# Patient Record
Sex: Female | Born: 1970 | Race: Black or African American | Hispanic: No | Marital: Single | Smoking: Never smoker
Health system: Southern US, Community
[De-identification: ages and names within clinical notes are randomized; demographics above are authoritative.]

## PROBLEM LIST (undated history)

## (undated) DIAGNOSIS — R12 Heartburn: Secondary | ICD-10-CM

## (undated) DIAGNOSIS — R51 Headache: Secondary | ICD-10-CM

## (undated) DIAGNOSIS — M1990 Unspecified osteoarthritis, unspecified site: Secondary | ICD-10-CM

## (undated) HISTORY — PX: CERVICAL SPINE SURGERY: SHX589

## (undated) HISTORY — PX: ABDOMINAL HYSTERECTOMY: SHX81

## (undated) HISTORY — PX: TUBAL LIGATION: SHX77

---

## 2008-05-20 ENCOUNTER — Emergency Department (HOSPITAL_COMMUNITY): Admission: EM | Admit: 2008-05-20 | Discharge: 2008-05-20 | Payer: Self-pay | Admitting: Emergency Medicine

## 2009-03-24 ENCOUNTER — Emergency Department (HOSPITAL_COMMUNITY): Admission: EM | Admit: 2009-03-24 | Discharge: 2009-03-24 | Payer: Self-pay | Admitting: Emergency Medicine

## 2009-04-06 ENCOUNTER — Emergency Department (HOSPITAL_COMMUNITY): Admission: EM | Admit: 2009-04-06 | Discharge: 2009-04-06 | Payer: Self-pay | Admitting: Emergency Medicine

## 2010-02-19 ENCOUNTER — Emergency Department (HOSPITAL_COMMUNITY)
Admission: EM | Admit: 2010-02-19 | Discharge: 2010-02-19 | Payer: Self-pay | Source: Home / Self Care | Admitting: Emergency Medicine

## 2010-03-03 ENCOUNTER — Ambulatory Visit (HOSPITAL_COMMUNITY): Admission: RE | Admit: 2010-03-03 | Discharge: 2010-03-03 | Payer: Self-pay | Admitting: Family Medicine

## 2010-06-22 LAB — URINALYSIS, ROUTINE W REFLEX MICROSCOPIC
Glucose, UA: NEGATIVE mg/dL
Hgb urine dipstick: NEGATIVE
Leukocytes, UA: NEGATIVE
Protein, ur: NEGATIVE mg/dL
Specific Gravity, Urine: 1.02 (ref 1.005–1.030)
pH: 7 (ref 5.0–8.0)

## 2010-06-22 LAB — URINE CULTURE: Culture: NO GROWTH

## 2010-06-22 LAB — URINE MICROSCOPIC-ADD ON

## 2011-01-26 ENCOUNTER — Other Ambulatory Visit (HOSPITAL_COMMUNITY): Payer: Self-pay | Admitting: Pediatrics

## 2011-01-26 ENCOUNTER — Other Ambulatory Visit (HOSPITAL_COMMUNITY): Payer: Self-pay | Admitting: *Deleted

## 2011-01-26 DIAGNOSIS — Z1231 Encounter for screening mammogram for malignant neoplasm of breast: Secondary | ICD-10-CM

## 2011-03-07 ENCOUNTER — Ambulatory Visit (HOSPITAL_COMMUNITY)
Admission: RE | Admit: 2011-03-07 | Discharge: 2011-03-07 | Disposition: A | Discharge status: Home / Self Care | Payer: 59 | Source: Ambulatory Visit | Attending: Pediatrics | Admitting: Pediatrics

## 2011-03-07 DIAGNOSIS — Z1231 Encounter for screening mammogram for malignant neoplasm of breast: Secondary | ICD-10-CM | POA: Insufficient documentation

## 2011-07-13 ENCOUNTER — Other Ambulatory Visit (HOSPITAL_COMMUNITY): Payer: Self-pay | Admitting: Orthopedic Surgery

## 2011-07-13 DIAGNOSIS — M25569 Pain in unspecified knee: Secondary | ICD-10-CM

## 2011-07-13 DIAGNOSIS — M545 Low back pain: Secondary | ICD-10-CM

## 2011-07-16 ENCOUNTER — Ambulatory Visit (HOSPITAL_COMMUNITY)
Admission: RE | Admit: 2011-07-16 | Discharge: 2011-07-16 | Disposition: A | Discharge status: Home / Self Care | Payer: 59 | Source: Ambulatory Visit | Attending: Orthopedic Surgery | Admitting: Orthopedic Surgery

## 2011-07-16 DIAGNOSIS — M25569 Pain in unspecified knee: Secondary | ICD-10-CM

## 2011-07-16 DIAGNOSIS — M545 Low back pain: Secondary | ICD-10-CM

## 2011-07-17 ENCOUNTER — Ambulatory Visit (HOSPITAL_COMMUNITY)
Admission: RE | Admit: 2011-07-17 | Discharge: 2011-07-17 | Disposition: A | Discharge status: Home / Self Care | Payer: 59 | Source: Ambulatory Visit | Attending: Orthopedic Surgery | Admitting: Orthopedic Surgery

## 2011-07-17 DIAGNOSIS — IMO0002 Reserved for concepts with insufficient information to code with codable children: Secondary | ICD-10-CM | POA: Insufficient documentation

## 2011-07-17 DIAGNOSIS — M25569 Pain in unspecified knee: Secondary | ICD-10-CM | POA: Insufficient documentation

## 2011-07-17 DIAGNOSIS — X58XXXA Exposure to other specified factors, initial encounter: Secondary | ICD-10-CM | POA: Insufficient documentation

## 2011-07-17 DIAGNOSIS — R1909 Other intra-abdominal and pelvic swelling, mass and lump: Secondary | ICD-10-CM | POA: Insufficient documentation

## 2011-07-17 DIAGNOSIS — M25559 Pain in unspecified hip: Secondary | ICD-10-CM | POA: Insufficient documentation

## 2011-07-17 DIAGNOSIS — R609 Edema, unspecified: Secondary | ICD-10-CM | POA: Insufficient documentation

## 2011-07-18 ENCOUNTER — Inpatient Hospital Stay (HOSPITAL_COMMUNITY): Admission: RE | Admit: 2011-07-18 | Payer: 59 | Source: Ambulatory Visit

## 2011-07-18 ENCOUNTER — Other Ambulatory Visit (HOSPITAL_COMMUNITY): Payer: 59

## 2011-07-22 ENCOUNTER — Other Ambulatory Visit (HOSPITAL_COMMUNITY): Payer: Self-pay | Admitting: Orthopedic Surgery

## 2011-07-22 ENCOUNTER — Other Ambulatory Visit: Payer: Self-pay | Admitting: Orthopedic Surgery

## 2011-07-22 DIAGNOSIS — N83209 Unspecified ovarian cyst, unspecified side: Secondary | ICD-10-CM

## 2011-07-22 DIAGNOSIS — N83202 Unspecified ovarian cyst, left side: Secondary | ICD-10-CM

## 2011-07-26 ENCOUNTER — Other Ambulatory Visit (HOSPITAL_COMMUNITY): Payer: 59

## 2011-07-26 ENCOUNTER — Ambulatory Visit (HOSPITAL_COMMUNITY)
Admission: RE | Admit: 2011-07-26 | Discharge: 2011-07-26 | Disposition: A | Discharge status: Home / Self Care | Payer: 59 | Source: Ambulatory Visit | Attending: Orthopedic Surgery | Admitting: Orthopedic Surgery

## 2011-07-26 DIAGNOSIS — N83209 Unspecified ovarian cyst, unspecified side: Secondary | ICD-10-CM | POA: Insufficient documentation

## 2011-07-26 DIAGNOSIS — N7013 Chronic salpingitis and oophoritis: Secondary | ICD-10-CM | POA: Insufficient documentation

## 2012-02-08 ENCOUNTER — Other Ambulatory Visit (HOSPITAL_COMMUNITY): Payer: Self-pay | Admitting: Obstetrics and Gynecology

## 2012-02-08 DIAGNOSIS — Z1231 Encounter for screening mammogram for malignant neoplasm of breast: Secondary | ICD-10-CM

## 2012-02-27 ENCOUNTER — Other Ambulatory Visit (HOSPITAL_COMMUNITY): Payer: Self-pay | Admitting: Obstetrics and Gynecology

## 2012-02-27 NOTE — H&P (Signed)
Jennifer Oneal  DICTATION # [id number] CSN# [id number]   Meriel Pica, MD 02/27/2012 1:52 PM

## 2012-02-28 NOTE — H&P (Signed)
NAMESHONTE, BEUTLER                 ACCOUNT NO.:  [id number]  MEDICAL RECORD NO.:  [id number]  LOCATION:                                 FACILITY:  PHYSICIAN:  Duke Salvia. Marcelle Overlie, M.D.DATE OF BIRTH:  Apr 18, 1970  DATE OF ADMISSION: DATE OF DISCHARGE:                             HISTORY & PHYSICAL   CHIEF COMPLAINT:  Pelvic pain.  HISTORY OF PRESENT ILLNESS:  A 41 year old G2, P1, prior hysterectomy done in [place] in 2012.  This patient has had an ectopic through a Pfannenstiel incision, also through that same incision, a tubal ligation with endometrial ablation.  In 2012, in [place], [place] through a midline incision, she had TH, ovaries were stated to be normal at that time.  Recently, she had an MR of her knee, when they were looking at the pelvis they saw a cyst and ordered a followup ultrasound that showed a simple left hydrosalpinx approximately 8.5 cm long with a small amount of free fluid.  When I saw her on August 13th, we did thyroid profile, lipid screen, glucose, and CA-125 that was 5.8.  FSH was 35 at that time.  In the mean time, she had a followup ultrasound in our office that showed persistence of the probable hydrosalpinx, but continues to have pain and presents at this time for open DL with LSO.  This procedure including risks related to bleeding, infection, transfusion, the possible need to complete the surgery by open technique along with her expected recovery time were all reviewed.  PAST MEDICAL HISTORY:  Allergies none.  CURRENT MEDICATIONS:  Ibuprofen p.r.n.  SURGICAL HISTORY:  Cesarean section x1, tubal and endometrial ablation, laparotomy for ectopic pregnancy, and hysterectomy in 2012.  REVIEW OF SYSTEMS:  Significant for prior history of anemia when she was still menstruating.  REVIEW OF SYSTEMS:  Her sister had an MI at age 61.  Also family history of diabetes, phlebitis, hypertension, and breast and lung cancer.  SOCIAL HISTORY:   Denies drug or tobacco use.  She does drink alcohol socially.  Her medical doctor is Dr. Sharee Pimple.  PHYSICAL EXAMINATION:  VITAL SIGNS:  BP 110/62.  She is afebrile. HEENT:  Unremarkable. NECK:  Supple without masses. LUNGS:  Clear. CARDIOVASCULAR:  Regular rate and rhythm without murmurs, rubs, or gallops. BREASTS:  Without masses. ABDOMEN:  Soft, flat, nontender. GU:  Vulva and vaginal cuff unremarkable.  Bimanual otherwise negative. No definite masses noted. EXTREMITIES:  Unremarkable. NEUROLOGIC:  Unremarkable.  IMPRESSION:  Pelvic pain with left hydrosalpinx.  PLAN:  Open diagnostic laparoscopy with LSO, possible laparotomy. Procedure and risks discussed as noted above.     Zariel Capano M. Marcelle Overlie, M.D.     RM[facility]/MEDQ  D:  02/27/2012  T:  02/28/2012  Job:  [id number]

## 2012-02-29 ENCOUNTER — Encounter (HOSPITAL_COMMUNITY): Payer: Self-pay | Admitting: Pharmacist

## 2012-03-06 ENCOUNTER — Ambulatory Visit (HOSPITAL_COMMUNITY): Payer: Managed Care, Other (non HMO)

## 2012-03-06 ENCOUNTER — Ambulatory Visit (HOSPITAL_COMMUNITY): Payer: 59

## 2012-03-06 ENCOUNTER — Encounter (HOSPITAL_COMMUNITY): Payer: Self-pay

## 2012-03-06 ENCOUNTER — Encounter (HOSPITAL_COMMUNITY)
Admission: RE | Admit: 2012-03-06 | Discharge: 2012-03-06 | Disposition: A | Discharge status: Home / Self Care | Payer: Managed Care, Other (non HMO) | Source: Ambulatory Visit | Attending: Obstetrics and Gynecology | Admitting: Obstetrics and Gynecology

## 2012-03-06 HISTORY — DX: Unspecified osteoarthritis, unspecified site: M19.90

## 2012-03-06 HISTORY — DX: Headache: R51

## 2012-03-06 HISTORY — DX: Heartburn: R12

## 2012-03-06 LAB — SURGICAL PCR SCREEN: MRSA, PCR: NEGATIVE

## 2012-03-06 LAB — CBC
MCV: 92.6 fL (ref 78.0–100.0)
Platelets: 300 10*3/uL (ref 150–400)
RDW: 12 % (ref 11.5–15.5)
WBC: 7.6 10*3/uL (ref 4.0–10.5)

## 2012-03-06 NOTE — Patient Instructions (Addendum)
Your procedure is scheduled on:03/12/12  Enter through the Main Entrance at :6am Pick up desk phone and dial [phone] and inform us of your arrival.  Please call [phone] if you have any problems the morning of surgery.  Remember: Do not eat or drink after midnight:Sunday   DO NOT wear jewelry, eye make-up, lipstick,body lotion, or dark fingernail polish. Do not shave for 48 hours prior to surgery.  If you are to be admitted after surgery, leave suitcase in car until your room has been assigned. Patients discharged on the day of surgery will not be allowed to drive home.

## 2012-03-12 ENCOUNTER — Encounter (HOSPITAL_COMMUNITY): Payer: Self-pay | Admitting: Anesthesiology

## 2012-03-12 ENCOUNTER — Ambulatory Visit (HOSPITAL_COMMUNITY): Payer: Managed Care, Other (non HMO) | Admitting: Anesthesiology

## 2012-03-12 ENCOUNTER — Inpatient Hospital Stay (HOSPITAL_COMMUNITY)
Admission: RE | Admit: 2012-03-12 | Discharge: 2012-03-14 | DRG: 743 | Disposition: A | Discharge status: Home / Self Care | Payer: Managed Care, Other (non HMO) | Source: Ambulatory Visit | Attending: Obstetrics and Gynecology | Admitting: Obstetrics and Gynecology

## 2012-03-12 ENCOUNTER — Encounter (HOSPITAL_COMMUNITY)
Admission: RE | Disposition: A | Discharge status: Home / Self Care | Payer: Self-pay | Source: Ambulatory Visit | Attending: Obstetrics and Gynecology

## 2012-03-12 ENCOUNTER — Encounter (HOSPITAL_COMMUNITY): Payer: Self-pay | Admitting: *Deleted

## 2012-03-12 DIAGNOSIS — N802 Endometriosis of fallopian tube: Secondary | ICD-10-CM | POA: Diagnosis present

## 2012-03-12 DIAGNOSIS — N801 Endometriosis of ovary: Secondary | ICD-10-CM | POA: Diagnosis present

## 2012-03-12 DIAGNOSIS — N949 Unspecified condition associated with female genital organs and menstrual cycle: Secondary | ICD-10-CM | POA: Diagnosis present

## 2012-03-12 DIAGNOSIS — N80109 Endometriosis of ovary, unspecified side, unspecified depth: Secondary | ICD-10-CM | POA: Diagnosis present

## 2012-03-12 DIAGNOSIS — N80209 Endometriosis of unspecified fallopian tube, unspecified depth: Secondary | ICD-10-CM | POA: Diagnosis present

## 2012-03-12 DIAGNOSIS — N7013 Chronic salpingitis and oophoritis: Principal | ICD-10-CM | POA: Diagnosis present

## 2012-03-12 DIAGNOSIS — N736 Female pelvic peritoneal adhesions (postinfective): Secondary | ICD-10-CM | POA: Diagnosis present

## 2012-03-12 DIAGNOSIS — Z5331 Laparoscopic surgical procedure converted to open procedure: Secondary | ICD-10-CM

## 2012-03-12 DIAGNOSIS — Z90710 Acquired absence of both cervix and uterus: Secondary | ICD-10-CM

## 2012-03-12 HISTORY — PX: LAPAROTOMY: SHX154

## 2012-03-12 HISTORY — PX: LAPAROSCOPY: SHX197

## 2012-03-12 HISTORY — PX: SALPINGOOPHORECTOMY: SHX82

## 2012-03-12 LAB — CBC
MCH: 30.8 pg (ref 26.0–34.0)
MCHC: 33.8 g/dL (ref 30.0–36.0)
Platelets: 286 10*3/uL (ref 150–400)

## 2012-03-12 SURGERY — LAPAROSCOPY OPERATIVE
Anesthesia: General

## 2012-03-12 MED ORDER — ONDANSETRON HCL 4 MG PO TABS: 4.0000 mg | ORAL_TABLET | Freq: Four times a day (QID) | ORAL | Status: DC | PRN

## 2012-03-12 MED ORDER — BUPIVACAINE HCL (PF) 0.5 % IJ SOLN
INTRAMUSCULAR | Status: AC
  Filled 2012-03-12: qty 270

## 2012-03-12 MED ORDER — KETOROLAC TROMETHAMINE 30 MG/ML IJ SOLN
INTRAMUSCULAR | Status: DC | PRN
  Administered 2012-03-12: 30 mg via INTRAVENOUS

## 2012-03-12 MED ORDER — MENTHOL 3 MG MT LOZG
1.0000 | LOZENGE | OROMUCOSAL | Status: DC | PRN
  Administered 2012-03-12: 3 mg via ORAL
  Filled 2012-03-12: qty 9

## 2012-03-12 MED ORDER — 0.9 % SODIUM CHLORIDE (POUR BTL) OPTIME
TOPICAL | Status: DC | PRN
  Administered 2012-03-12: 1000 mL

## 2012-03-12 MED ORDER — DEXAMETHASONE SODIUM PHOSPHATE 4 MG/ML IJ SOLN
INTRAMUSCULAR | Status: DC | PRN
  Administered 2012-03-12: 10 mg via INTRAVENOUS

## 2012-03-12 MED ORDER — NEOSTIGMINE METHYLSULFATE 1 MG/ML IJ SOLN
INTRAMUSCULAR | Status: AC
  Filled 2012-03-12: qty 10

## 2012-03-12 MED ORDER — MORPHINE SULFATE (PF) 1 MG/ML IV SOLN
INTRAVENOUS | Status: DC
  Administered 2012-03-12: 5 mg via INTRAVENOUS
  Administered 2012-03-12: 12:00:00 via INTRAVENOUS
  Administered 2012-03-13 (×2): 1 mg via INTRAVENOUS
  Administered 2012-03-13: 2 mg via INTRAVENOUS
  Filled 2012-03-12: qty 25

## 2012-03-12 MED ORDER — BUPIVACAINE HCL (PF) 0.25 % IJ SOLN
INTRAMUSCULAR | Status: DC | PRN
  Administered 2012-03-12: 8 mL

## 2012-03-12 MED ORDER — FENTANYL CITRATE 0.05 MG/ML IJ SOLN
INTRAMUSCULAR | Status: AC
  Administered 2012-03-12: 50 ug via INTRAVENOUS
  Filled 2012-03-12: qty 2

## 2012-03-12 MED ORDER — LIDOCAINE HCL (CARDIAC) 20 MG/ML IV SOLN
INTRAVENOUS | Status: AC
  Filled 2012-03-12: qty 5

## 2012-03-12 MED ORDER — DIPHENHYDRAMINE HCL 50 MG/ML IJ SOLN: 12.5000 mg | Freq: Four times a day (QID) | INTRAMUSCULAR | Status: DC | PRN

## 2012-03-12 MED ORDER — ONDANSETRON HCL 4 MG/2ML IJ SOLN
INTRAMUSCULAR | Status: DC | PRN
  Administered 2012-03-12: 4 mg via INTRAVENOUS

## 2012-03-12 MED ORDER — NALOXONE HCL 0.4 MG/ML IJ SOLN: 0.4000 mg | INTRAMUSCULAR | Status: DC | PRN

## 2012-03-12 MED ORDER — CEFAZOLIN SODIUM-DEXTROSE 2-3 GM-% IV SOLR
2.0000 g | Freq: Once | INTRAVENOUS | Status: AC
  Administered 2012-03-12: 2 g via INTRAVENOUS

## 2012-03-12 MED ORDER — MEPERIDINE HCL 25 MG/ML IJ SOLN: 6.2500 mg | INTRAMUSCULAR | Status: DC | PRN

## 2012-03-12 MED ORDER — ONDANSETRON HCL 4 MG/2ML IJ SOLN: 4.0000 mg | Freq: Four times a day (QID) | INTRAMUSCULAR | Status: DC | PRN

## 2012-03-12 MED ORDER — IBUPROFEN 800 MG PO TABS
800.0000 mg | ORAL_TABLET | Freq: Three times a day (TID) | ORAL | Status: DC | PRN
  Administered 2012-03-13 – 2012-03-14 (×3): 800 mg via ORAL
  Filled 2012-03-12 (×3): qty 1

## 2012-03-12 MED ORDER — KETOROLAC TROMETHAMINE 30 MG/ML IJ SOLN
INTRAMUSCULAR | Status: AC
  Filled 2012-03-12: qty 1

## 2012-03-12 MED ORDER — GLYCOPYRROLATE 0.2 MG/ML IJ SOLN
INTRAMUSCULAR | Status: AC
  Filled 2012-03-12: qty 2

## 2012-03-12 MED ORDER — HYDROMORPHONE HCL PF 1 MG/ML IJ SOLN
INTRAMUSCULAR | Status: DC | PRN
  Administered 2012-03-12: 1 mg via INTRAVENOUS

## 2012-03-12 MED ORDER — FENTANYL CITRATE 0.05 MG/ML IJ SOLN
INTRAMUSCULAR | Status: AC
  Filled 2012-03-12: qty 5

## 2012-03-12 MED ORDER — KETOROLAC TROMETHAMINE 30 MG/ML IJ SOLN
30.0000 mg | Freq: Four times a day (QID) | INTRAMUSCULAR | Status: DC
  Administered 2012-03-12 – 2012-03-13 (×3): 30 mg via INTRAVENOUS
  Filled 2012-03-12: qty 1

## 2012-03-12 MED ORDER — ONDANSETRON HCL 4 MG/2ML IJ SOLN: 4.0000 mg | Freq: Once | INTRAMUSCULAR | Status: DC | PRN

## 2012-03-12 MED ORDER — KETOROLAC TROMETHAMINE 30 MG/ML IJ SOLN: 15.0000 mg | Freq: Once | INTRAMUSCULAR | Status: DC | PRN

## 2012-03-12 MED ORDER — BUPIVACAINE HCL (PF) 0.25 % IJ SOLN
INTRAMUSCULAR | Status: AC
  Filled 2012-03-12: qty 30

## 2012-03-12 MED ORDER — NEOSTIGMINE METHYLSULFATE 1 MG/ML IJ SOLN
INTRAMUSCULAR | Status: DC | PRN
  Administered 2012-03-12: 4 mg via INTRAVENOUS

## 2012-03-12 MED ORDER — OXYCODONE-ACETAMINOPHEN 5-325 MG PO TABS
1.0000 | ORAL_TABLET | ORAL | Status: DC | PRN
  Administered 2012-03-13: 1 via ORAL
  Filled 2012-03-12 (×2): qty 1

## 2012-03-12 MED ORDER — DIPHENHYDRAMINE HCL 12.5 MG/5ML PO ELIX: 12.5000 mg | ORAL_SOLUTION | Freq: Four times a day (QID) | ORAL | Status: DC | PRN

## 2012-03-12 MED ORDER — ROCURONIUM BROMIDE 50 MG/5ML IV SOLN
INTRAVENOUS | Status: AC
  Filled 2012-03-12: qty 1

## 2012-03-12 MED ORDER — ROCURONIUM BROMIDE 100 MG/10ML IV SOLN
INTRAVENOUS | Status: DC | PRN
  Administered 2012-03-12: 20 mg via INTRAVENOUS
  Administered 2012-03-12: 30 mg via INTRAVENOUS

## 2012-03-12 MED ORDER — KETOROLAC TROMETHAMINE 30 MG/ML IJ SOLN
30.0000 mg | Freq: Once | INTRAMUSCULAR | Status: DC
  Filled 2012-03-12 (×2): qty 1

## 2012-03-12 MED ORDER — PROPOFOL 10 MG/ML IV EMUL
INTRAVENOUS | Status: DC | PRN
  Administered 2012-03-12: 180 mg via INTRAVENOUS

## 2012-03-12 MED ORDER — HYDROMORPHONE HCL PF 1 MG/ML IJ SOLN
INTRAMUSCULAR | Status: AC
  Filled 2012-03-12: qty 1

## 2012-03-12 MED ORDER — GLYCOPYRROLATE 0.2 MG/ML IJ SOLN
INTRAMUSCULAR | Status: DC | PRN
  Administered 2012-03-12: 0.4 mg via INTRAVENOUS

## 2012-03-12 MED ORDER — ONDANSETRON HCL 4 MG/2ML IJ SOLN
INTRAMUSCULAR | Status: AC
  Filled 2012-03-12: qty 2

## 2012-03-12 MED ORDER — SODIUM CHLORIDE 0.9 % IJ SOLN: 9.0000 mL | INTRAMUSCULAR | Status: DC | PRN

## 2012-03-12 MED ORDER — FENTANYL CITRATE 0.05 MG/ML IJ SOLN
25.0000 ug | INTRAMUSCULAR | Status: DC | PRN
  Administered 2012-03-12 (×2): 50 ug via INTRAVENOUS

## 2012-03-12 MED ORDER — KETOROLAC TROMETHAMINE 30 MG/ML IJ SOLN: 30.0000 mg | Freq: Four times a day (QID) | INTRAMUSCULAR | Status: DC

## 2012-03-12 MED ORDER — MIDAZOLAM HCL 2 MG/2ML IJ SOLN
INTRAMUSCULAR | Status: AC
  Filled 2012-03-12: qty 2

## 2012-03-12 MED ORDER — LACTATED RINGERS IV SOLN
INTRAVENOUS | Status: DC
  Administered 2012-03-12 (×2): via INTRAVENOUS

## 2012-03-12 MED ORDER — ZOLPIDEM TARTRATE 5 MG PO TABS: 5.0000 mg | ORAL_TABLET | Freq: Every evening | ORAL | Status: DC | PRN

## 2012-03-12 MED ORDER — CEFAZOLIN SODIUM-DEXTROSE 2-3 GM-% IV SOLR: 2.0000 g | INTRAVENOUS | Status: DC

## 2012-03-12 MED ORDER — BUPIVACAINE HCL (PF) 0.5 % IJ SOLN
INTRAMUSCULAR | Status: DC | PRN
  Administered 2012-03-12: 270 mL

## 2012-03-12 MED ORDER — PROPOFOL 10 MG/ML IV EMUL
INTRAVENOUS | Status: AC
  Filled 2012-03-12: qty 20

## 2012-03-12 MED ORDER — DEXAMETHASONE SODIUM PHOSPHATE 10 MG/ML IJ SOLN
INTRAMUSCULAR | Status: AC
  Filled 2012-03-12: qty 1

## 2012-03-12 MED ORDER — FENTANYL CITRATE 0.05 MG/ML IJ SOLN
INTRAMUSCULAR | Status: DC | PRN
  Administered 2012-03-12 (×5): 50 ug via INTRAVENOUS

## 2012-03-12 MED ORDER — PHENYLEPHRINE 40 MCG/ML (10ML) SYRINGE FOR IV PUSH (FOR BLOOD PRESSURE SUPPORT)
PREFILLED_SYRINGE | INTRAVENOUS | Status: AC
  Filled 2012-03-12: qty 5

## 2012-03-12 MED ORDER — MIDAZOLAM HCL 5 MG/5ML IJ SOLN
INTRAMUSCULAR | Status: DC | PRN
  Administered 2012-03-12: 1 mg via INTRAVENOUS

## 2012-03-12 MED ORDER — SCOPOLAMINE 1 MG/3DAYS TD PT72
MEDICATED_PATCH | TRANSDERMAL | Status: AC
  Filled 2012-03-12: qty 1

## 2012-03-12 MED ORDER — CEFAZOLIN SODIUM-DEXTROSE 2-3 GM-% IV SOLR
INTRAVENOUS | Status: AC
  Filled 2012-03-12: qty 50

## 2012-03-12 MED ORDER — SCOPOLAMINE 1 MG/3DAYS TD PT72
1.0000 | MEDICATED_PATCH | TRANSDERMAL | Status: DC
  Administered 2012-03-12: 1.5 mg via TRANSDERMAL

## 2012-03-12 MED ORDER — LIDOCAINE HCL (CARDIAC) 20 MG/ML IV SOLN
INTRAVENOUS | Status: DC | PRN
  Administered 2012-03-12: 40 mg via INTRAVENOUS
  Administered 2012-03-12: 20 mg via INTRAVENOUS
  Administered 2012-03-12: 30 mg via INTRAVENOUS

## 2012-03-12 MED ORDER — DEXTROSE IN LACTATED RINGERS 5 % IV SOLN
INTRAVENOUS | Status: DC
  Administered 2012-03-12 – 2012-03-13 (×3): via INTRAVENOUS

## 2012-03-12 MED ORDER — PHENYLEPHRINE HCL 10 MG/ML IJ SOLN
INTRAMUSCULAR | Status: DC | PRN
  Administered 2012-03-12: 120 ug via INTRAVENOUS
  Administered 2012-03-12: 80 ug via INTRAVENOUS

## 2012-03-12 MED ORDER — BUTORPHANOL TARTRATE 1 MG/ML IJ SOLN: 1.0000 mg | INTRAMUSCULAR | Status: DC | PRN

## 2012-03-12 SURGICAL SUPPLY — 60 items
CABLE HIGH FREQUENCY MONO STRZ (ELECTRODE) IMPLANT
CANISTER SUCTION 2500CC (MISCELLANEOUS) ×3 IMPLANT
CATH KIT ON Q 5IN DUAL SLV (PAIN MANAGEMENT) ×3 IMPLANT
CATH ROBINSON RED A/P 16FR (CATHETERS) IMPLANT
CLOTH BEACON ORANGE TIMEOUT ST (SAFETY) ×3 IMPLANT
CONTAINER PREFILL 10% NBF 60ML (FORM) IMPLANT
DECANTER SPIKE VIAL GLASS SM (MISCELLANEOUS) IMPLANT
DERMABOND ADVANCED (GAUZE/BANDAGES/DRESSINGS) ×1
DERMABOND ADVANCED .7 DNX12 (GAUZE/BANDAGES/DRESSINGS) ×2 IMPLANT
DRESSING TELFA 8X3 (GAUZE/BANDAGES/DRESSINGS) ×3 IMPLANT
DRSG TEGADERM 2.38X2.75 (GAUZE/BANDAGES/DRESSINGS) IMPLANT
GAUZE SPONGE 4X4 12PLY STRL LF (GAUZE/BANDAGES/DRESSINGS) ×6 IMPLANT
GLOVE BIO SURGEON STRL SZ7 (GLOVE) ×6 IMPLANT
GLOVE BIO SURGEON STRL SZ8 (GLOVE) ×3 IMPLANT
GLOVE BIOGEL PI IND STRL 6.5 (GLOVE) IMPLANT
GLOVE BIOGEL PI IND STRL 7.0 (GLOVE) ×4 IMPLANT
GLOVE BIOGEL PI INDICATOR 6.5 (GLOVE)
GLOVE BIOGEL PI INDICATOR 7.0 (GLOVE) ×2
GLOVE NEODERM STER SZ 7 (GLOVE) ×3 IMPLANT
GLOVE SURG SS PI 6.5 STRL IVOR (GLOVE) ×3 IMPLANT
GOWN PREVENTION PLUS LG XLONG (DISPOSABLE) ×12 IMPLANT
NEEDLE HYPO 25X1 1.5 SAFETY (NEEDLE) IMPLANT
NEEDLE INSUFFLATION 14GA 120MM (NEEDLE) ×3 IMPLANT
NS IRRIG 1000ML POUR BTL (IV SOLUTION) ×3 IMPLANT
PACK ABDOMINAL GYN (CUSTOM PROCEDURE TRAY) ×3 IMPLANT
PACK LAPAROSCOPY BASIN (CUSTOM PROCEDURE TRAY) ×3 IMPLANT
PAD ABD 7.5X8 STRL (GAUZE/BANDAGES/DRESSINGS) ×3 IMPLANT
PAD OB MATERNITY 4.3X12.25 (PERSONAL CARE ITEMS) ×3 IMPLANT
PENCIL BUTTON HOLSTER BLD 10FT (ELECTRODE) ×3 IMPLANT
POUCH SPECIMEN RETRIEVAL 10MM (ENDOMECHANICALS) IMPLANT
PROTECTOR NERVE ULNAR (MISCELLANEOUS) ×3 IMPLANT
SEALER TISSUE G2 CVD JAW 35 (ENDOMECHANICALS) IMPLANT
SEALER TISSUE G2 CVD JAW 45CM (ENDOMECHANICALS) IMPLANT
SET IRRIG TUBING LAPAROSCOPIC (IRRIGATION / IRRIGATOR) IMPLANT
SPONGE GAUZE 4X4 12PLY (GAUZE/BANDAGES/DRESSINGS) ×3 IMPLANT
SPONGE LAP 18X18 X RAY DECT (DISPOSABLE) ×6 IMPLANT
STRIP CLOSURE SKIN 1/4X4 (GAUZE/BANDAGES/DRESSINGS) ×3 IMPLANT
SUT CHROMIC 0 CT 1 (SUTURE) ×3 IMPLANT
SUT MON AB 2-0 CT1 36 (SUTURE) ×9 IMPLANT
SUT MON AB 4-0 PS1 27 (SUTURE) ×3 IMPLANT
SUT PDS AB 0 CT1 27 (SUTURE) ×6 IMPLANT
SUT SILK 3 0 SH CR/8 (SUTURE) ×3 IMPLANT
SUT VIC AB 0 CT1 18XCR BRD8 (SUTURE) ×2 IMPLANT
SUT VIC AB 0 CT1 8-18 (SUTURE) ×1
SUT VIC AB 2-0 CT1 18 (SUTURE) IMPLANT
SUT VIC AB 2-0 UR6 27 (SUTURE) ×3 IMPLANT
SUT VIC AB 3-0 CT1 27 (SUTURE) ×1
SUT VIC AB 3-0 CT1 TAPERPNT 27 (SUTURE) ×2 IMPLANT
SUT VICRYL 0 TIES 12 18 (SUTURE) ×3 IMPLANT
SUT VICRYL RAPIDE 4/0 PS 2 (SUTURE) ×6 IMPLANT
SYR CONTROL 10ML LL (SYRINGE) ×3 IMPLANT
TAPE CLOTH SURG 4X10 WHT LF (GAUZE/BANDAGES/DRESSINGS) ×3 IMPLANT
TOWEL OR 17X24 6PK STRL BLUE (TOWEL DISPOSABLE) ×6 IMPLANT
TRAY FOLEY CATH 14FR (SET/KITS/TRAYS/PACK) ×3 IMPLANT
TROCAR Z-THREAD BLADED 11X100M (TROCAR) ×3 IMPLANT
TROCAR Z-THREAD BLADED 5X100MM (TROCAR) ×6 IMPLANT
TUBING CONNECTING 10 (TUBING) ×3 IMPLANT
WARMER LAPAROSCOPE (MISCELLANEOUS) ×3 IMPLANT
WATER STERILE IRR 1000ML POUR (IV SOLUTION) ×3 IMPLANT
YANKAUER SUCT BULB TIP NO VENT (SUCTIONS) ×3 IMPLANT

## 2012-03-12 NOTE — Anesthesia Postprocedure Evaluation (Signed)
Anesthesia Post Note  Patient: Jennifer Oneal  Procedure(s) Performed: Procedure(s) (LRB): LAPAROSCOPY OPERATIVE (N/A) SALPINGO OOPHORECTOMY (Bilateral) EXPLORATORY LAPAROTOMY (N/A)  Anesthesia type: General  Patient location: PACU  Post pain: Pain level controlled  Post assessment: Post-op Vital signs reviewed  Last Vitals:  Filed Vitals:   03/12/12 1100  BP: 90/57  Pulse: 59  Temp: 36.6 C  Resp: 18    Post vital signs: Reviewed  Level of consciousness: sedated  Complications: No apparent anesthesia complications

## 2012-03-12 NOTE — OR Nursing (Signed)
On q pump intact to abdominal incision intact upon arrival from or.. 10:48 on q pump intact upon arrival

## 2012-03-12 NOTE — Anesthesia Postprocedure Evaluation (Signed)
  Anesthesia Post-op Note  Patient: Jennifer Oneal  Procedure(s) Performed: Procedure(s) (LRB) with comments: LAPAROSCOPY OPERATIVE (N/A) - open laparoscopy SALPINGO OOPHORECTOMY (Bilateral) EXPLORATORY LAPAROTOMY (N/A)  Patient Location: PACU and Women's Unit  Anesthesia Type:General  Level of Consciousness: awake, alert , oriented and patient cooperative  Airway and Oxygen Therapy: Patient Spontanous Breathing  Post-op Pain: none  Post-op Assessment: Post-op Vital signs reviewed and Patient's Cardiovascular Status Stable  Post-op Vital Signs: Reviewed and stable  Complications: No apparent anesthesia complications

## 2012-03-12 NOTE — Transfer of Care (Signed)
Immediate Anesthesia Transfer of Care Note  Patient: Jennifer Oneal  Procedure(s) Performed: Procedure(s) (LRB) with comments: LAPAROSCOPY OPERATIVE (N/A) - open laparoscopy SALPINGO OOPHORECTOMY (Bilateral) EXPLORATORY LAPAROTOMY (N/A)  Patient Location: PACU  Anesthesia Type:General  Level of Consciousness: awake, sedated and patient cooperative  Airway & Oxygen Therapy: Patient Spontanous Breathing and Patient connected to nasal cannula oxygen  Post-op Assessment: Report given to PACU RN and Post -op Vital signs reviewed and stable  Post vital signs: Reviewed and stable  Complications: No apparent anesthesia complications

## 2012-03-12 NOTE — Anesthesia Preprocedure Evaluation (Addendum)
Anesthesia Evaluation  Patient identified by MRN, date of birth, ID band Patient awake    Reviewed: Allergy & Precautions, H&P , NPO status , Patient's Chart, lab work & pertinent test results  Airway Mallampati: II TM Distance: >3 FB Neck ROM: full    Dental No notable dental hx. (+) Teeth Intact   Pulmonary neg pulmonary ROS,    Pulmonary exam normal       Cardiovascular negative cardio ROS      Neuro/Psych negative psych ROS   GI/Hepatic negative GI ROS, Neg liver ROS,   Endo/Other  negative endocrine ROS  Renal/GU negative Renal ROS  negative genitourinary   Musculoskeletal negative musculoskeletal ROS (+)   Abdominal Normal abdominal exam  (+)   Peds  Hematology negative hematology ROS (+)   Anesthesia Other Findings   Reproductive/Obstetrics negative OB ROS                          Anesthesia Physical Anesthesia Plan  ASA: II  Anesthesia Plan: General   Post-op Pain Management:    Induction: Intravenous  Airway Management Planned: Oral ETT  Additional Equipment:   Intra-op Plan:   Post-operative Plan: Extubation in OR  Informed Consent: I have reviewed the patients History and Physical, chart, labs and discussed the procedure including the risks, benefits and alternatives for the proposed anesthesia with the patient or authorized representative who has indicated his/her understanding and acceptance.   Dental Advisory Given  Plan Discussed with: CRNA and Surgeon  Anesthesia Plan Comments:         Anesthesia Quick Evaluation

## 2012-03-12 NOTE — Addendum Note (Signed)
Addendum  created 03/12/12 1249 by Orlie Pollen, CRNA   Modules edited:Notes Section

## 2012-03-12 NOTE — Op Note (Signed)
Preoperative diagnosis: Pelvic pain, left hydrosalpinx  Postoperative diagnosis: Same, extensive bilateral adnexal adhesions  Procedure: 1. Diagnostic laparoscopy, open   2. Laparotomy with lysis of adhesions, BSO  Surgeon: Marcelle Overlie  Assistant: Huntley Dec  Anesthesia: Gen.  EBL: 100 cc  Specimens removed: Bilateral tubes and ovaries, to pathology  Complications: None    Procedure and findings:  The patient was taken the operating room after an adequate level of general anesthesia was obtained with the patient's legs elevated the abdomen perineum were prepped and draped in usual fashion for laparoscopy. Foley catheter was positioned. Appropriate timeout for taken at that point. The subumbilical area was infiltrated with quarter percent Marcaine plain was elevated between 2 Allis clamps the skin, fascia and peritoneum was entered separately using [employer] retractors. The peritoneum was identified and entered sharply surgeon's finger was then used to explore the surrounding area there were some minimal adhesions to the left lateral but below. The lap scopic sleeve was inserted and pneumoperitoneum was then achieved. After this was accomplished the laparoscope was inserted 3 finger breaths above symphysis in the midline a 5 mm trocar was inserted under direct visualization. With the patient in Trendelenburg the pelvic findings were as follows  The uterus is surgically absent there was a right ovarian cyst approximately 4 cm smooth-walled dense adhesions to the right tube and every 2 pelvic sidewall the sigmoid colon was adherent to the left pelvic sidewall could not identify the left tube and every. Decision made proceed laparotomy that point.  The laparoscope was removed the fascia closed with a 2-0 Vicryl suture and a 4-0 Monocryl subcuticular at the umbilicus attention directed to the old Pfannenstiel scar this was incised carried down to the fascia which was incised and extended transversely.  Rectus muscle divided in the midline peritoneum entered superiorly and extended in a vertical fashion. There were some omental adhesions into the left anterior abdominal wall which were lysed in an avascular plane once this was freed up and O'Connor-O'Sullivan retractor was positioned bowel Speck superiorly out of the field the right tube and every were freed up with sharp and blunt dissection the smooth-walled cyst was noted the rectoperineal space on the right was opened, the left IP ligament was dissected free the course of the right pelvic ureter was well below. The IP ligament was clamped divided and free tie with 0 Vicryl free-tie. The remainder of the right tube and every were dissected free with a clamp cut and suture technique. The left ovary was adherent to the left pelvic sidewall behind an apparent left hydrosalpinx the rectoperineal space on that side was entered and the ovary was dissected free once this was placed on tension further sharp and blunt dissection was used to free up the left tube and ovary with care taken to separate the surrounding sigmoid colon. Once this was freed up the left IP ligament was identified the course of the left ureter was well below the left IP ligament was then clamped divided and suture ligated with 0 Vicryl suture remaining pedicles were clamped cut and suture. The pelvis is irrigated with saline and noted be hemostatic. Prior to closure sponge denies precast reported as correct x2 the appendix was visualized and appeared to be normal. Peritoneum closed the running 2-0 Vicryl suture. 2-0 Vicryl interrupted sutures used to approximate the rectus muscles in the midline the fascia then closed laterally to midline with a 0 PDS suture.  This point the first six-inch needle introducer was then used to introduce  and On-Q catheter into the subfascial space a second six-inch needle introducer was then used to secure and the On-Q catheter in the subcutaneous space. Once these  were affixed the remainder the fascia was closed with a 0 PDS suture. The subcutaneous tissue was undermined to release scar tissue to effect better closure this was irrigated noted be hemostatic 4-0 Monocryl subcuticular suture with a pressure dressing. The On-Q catheters PROM with 4 cc 1% Xylocaine at each site. Clear urine noted in the patient tolerated this well went to recovery room in good condition.  Dictated with [facility]  Jennifer Oneal.D.

## 2012-03-12 NOTE — Progress Notes (Signed)
The patient was re-examined with no change in status 

## 2012-03-13 ENCOUNTER — Encounter (HOSPITAL_COMMUNITY): Payer: Self-pay | Admitting: Obstetrics and Gynecology

## 2012-03-13 LAB — CBC
MCHC: 33.2 g/dL (ref 30.0–36.0)
MCV: 90.9 fL (ref 78.0–100.0)
Platelets: 284 10*3/uL (ref 150–400)
RDW: 11.9 % (ref 11.5–15.5)
WBC: 13.8 10*3/uL — ABNORMAL HIGH (ref 4.0–10.5)

## 2012-03-13 NOTE — Progress Notes (Signed)
1 Day Post-Op Procedure(s) (LRB): LAPAROSCOPY OPERATIVE (N/A) SALPINGO OOPHORECTOMY (Bilateral) EXPLORATORY LAPAROTOMY (N/A)  Subjective: Patient reports tolerating PO.    Objective:BP 102/65  Pulse 97  Temp 99 F (37.2 C) (Oral)  Resp 18  Ht 5\' 2"  (1.575 m)  Wt 178 lb (80.74 kg)  BMI 32.56 kg/m2  SpO2 97% CBC    Component Value Date/Time   WBC 13.8* 03/13/2012 0540   RBC 3.41* 03/13/2012 0540   HGB 10.3* 03/13/2012 0540   HCT 31.0* 03/13/2012 0540   PLT 284 03/13/2012 0540   MCV 90.9 03/13/2012 0540   MCH 30.2 03/13/2012 0540   MCHC 33.2 03/13/2012 0540   RDW 11.9 03/13/2012 0540     I have reviewed patient's vital signs and labs.  inc C/D, + BS  Assessment: s/p Procedure(s) (LRB) with comments: LAPAROSCOPY OPERATIVE (N/A) - open laparoscopy SALPINGO OOPHORECTOMY (Bilateral) EXPLORATORY LAPAROTOMY (N/A): stable  Plan: Encourage ambulation  LOS: 1 day    Patty Leitzke M 03/13/2012, 8:05 AM

## 2012-03-14 MED ORDER — IBUPROFEN 800 MG PO TABS: 800.0000 mg | ORAL_TABLET | Freq: Three times a day (TID) | ORAL | Status: AC | PRN

## 2012-03-14 MED ORDER — OXYCODONE-ACETAMINOPHEN 5-325 MG PO TABS: 1.0000 | ORAL_TABLET | ORAL | Status: AC | PRN

## 2012-03-14 NOTE — Progress Notes (Signed)
2 Days Post-Op Procedure(s) (LRB): LAPAROSCOPY OPERATIVE (N/A) SALPINGO OOPHORECTOMY (Bilateral) EXPLORATORY LAPAROTOMY (N/A)  Subjective: Patient reports tolerating PO.    Objective: BP 101/60  Pulse 68  Temp 98.1 F (36.7 C) (Oral)  Resp 20  Ht 5\' 2"  (1.575 m)  Wt 178 lb (80.74 kg)  BMI 32.56 kg/m2  SpO2 100% I have reviewed patient's vital signs.  On Q cath out intact X 2, abd soft+ BS, inc C/D  Assessment: s/p Procedure(s) (LRB) with comments: LAPAROSCOPY OPERATIVE (N/A) - open laparoscopy SALPINGO OOPHORECTOMY (Bilateral) EXPLORATORY LAPAROTOMY (N/A): stable  Plan: Discharge home  LOS: 2 days    Jennifer Oneal M 03/14/2012, 8:12 AM

## 2012-03-14 NOTE — Discharge Summary (Signed)
Physician Discharge Summary  Patient ID: Jennifer Oneal MRN: [id number] DOB/AGE: 42-26-1972 42 y.o.  Admit date: 03/12/2012 Discharge date: 03/14/2012  Admission Diagnoses:pelvic pain  Discharge Diagnoses: same , S/P BSO, LOA Active Problems:  * No active hospital problems. *    Discharged Condition: good  Hospital Course: DL>>EL with LOA and BSO  Consults: None  Significant Diagnostic Studies: labs:  Results for orders placed during the hospital encounter of 03/12/12 (from the past 48 hour(s))  CBC     Status: Abnormal   Collection Time   03/12/12 12:25 PM      Component Value Range Comment   WBC 18.2 (*) 4.0 - 10.5 K/uL    RBC 3.80 (*) 3.87 - 5.11 MIL/uL    Hemoglobin 11.7 (*) 12.0 - 15.0 g/dL    HCT 82.9 (*) 56.2 - 46.0 %    MCV 91.1  78.0 - 100.0 fL    MCH 30.8  26.0 - 34.0 pg    MCHC 33.8  30.0 - 36.0 g/dL    RDW 13.0  86.5 - 78.4 %    Platelets 286  150 - 400 K/uL   CBC     Status: Abnormal   Collection Time   03/13/12  5:40 AM      Component Value Range Comment   WBC 13.8 (*) 4.0 - 10.5 K/uL    RBC 3.41 (*) 3.87 - 5.11 MIL/uL    Hemoglobin 10.3 (*) 12.0 - 15.0 g/dL    HCT 69.6 (*) 29.5 - 46.0 %    MCV 90.9  78.0 - 100.0 fL    MCH 30.2  26.0 - 34.0 pg    MCHC 33.2  30.0 - 36.0 g/dL    RDW 28.4  13.2 - 44.0 %    Platelets 284  150 - 400 K/uL     Treatments: surgery: BSO  Discharge Exam: Blood pressure 101/60, pulse 68, temperature 98.1 F (36.7 C), temperature source Oral, resp. rate 20, height 5\' 2"  (1.575 m), weight 178 lb (80.74 kg), SpO2 100.00%. abd soft + BS, Inc C/D  Disposition: Final discharge disposition not confirmed  Discharge Orders    Future Appointments: Provider: Department: Dept Phone: Center:   03/23/2012 11:45 AM [facility] Lovie Macadamia MAMMOGRAPHY [phone]       Medication List     As of 03/14/2012  8:15 AM    STOP taking these medications         COMPOUNDED TOPICALS BUILDER      TAKE these  medications         acidophilus Caps   Take 1 capsule by mouth daily.      ibuprofen 800 MG tablet   Commonly known as: ADVIL,MOTRIN   Take 1 tablet (800 mg total) by mouth every 8 (eight) hours as needed (mild pain).      oxyCODONE-acetaminophen 5-325 MG per tablet   Commonly known as: PERCOCET/ROXICET   Take 1-2 tablets by mouth every 4 (four) hours as needed (moderate to severe pain (when tolerating fluids)).           Follow-up Information    Follow up with Meriel Pica, MD.   Contact information:   [address] [place] [address] [phone]          Signed: Meriel Pica 03/14/2012, 8:15 AM

## 2012-03-23 ENCOUNTER — Ambulatory Visit (HOSPITAL_COMMUNITY)
Admission: RE | Admit: 2012-03-23 | Discharge: 2012-03-23 | Disposition: A | Discharge status: Home / Self Care | Payer: Managed Care, Other (non HMO) | Source: Ambulatory Visit | Attending: Obstetrics and Gynecology | Admitting: Obstetrics and Gynecology

## 2012-03-23 DIAGNOSIS — Z1231 Encounter for screening mammogram for malignant neoplasm of breast: Secondary | ICD-10-CM | POA: Insufficient documentation

## 2013-03-11 ENCOUNTER — Other Ambulatory Visit (HOSPITAL_COMMUNITY): Payer: Self-pay | Admitting: Obstetrics and Gynecology

## 2013-03-11 DIAGNOSIS — Z1231 Encounter for screening mammogram for malignant neoplasm of breast: Secondary | ICD-10-CM

## 2013-03-25 ENCOUNTER — Ambulatory Visit (HOSPITAL_COMMUNITY): Payer: Managed Care, Other (non HMO)

## 2013-03-26 ENCOUNTER — Ambulatory Visit (HOSPITAL_COMMUNITY): Payer: Managed Care, Other (non HMO)

## 2013-04-15 ENCOUNTER — Ambulatory Visit (HOSPITAL_COMMUNITY)
Admission: RE | Admit: 2013-04-15 | Discharge: 2013-04-15 | Disposition: A | Discharge status: Home / Self Care | Payer: Federal, State, Local not specified - PPO | Source: Ambulatory Visit | Attending: Obstetrics and Gynecology | Admitting: Obstetrics and Gynecology

## 2013-04-15 ENCOUNTER — Ambulatory Visit (HOSPITAL_COMMUNITY): Payer: Managed Care, Other (non HMO)

## 2013-04-15 DIAGNOSIS — Z1231 Encounter for screening mammogram for malignant neoplasm of breast: Secondary | ICD-10-CM | POA: Insufficient documentation

## 2013-04-16 ENCOUNTER — Ambulatory Visit (HOSPITAL_COMMUNITY): Payer: Managed Care, Other (non HMO)

## 2013-10-31 ENCOUNTER — Other Ambulatory Visit: Payer: Self-pay | Admitting: Internal Medicine

## 2013-10-31 DIAGNOSIS — G935 Compression of brain: Secondary | ICD-10-CM

## 2013-11-08 ENCOUNTER — Other Ambulatory Visit: Payer: Managed Care, Other (non HMO)

## 2014-04-17 ENCOUNTER — Other Ambulatory Visit: Payer: Self-pay | Admitting: Occupational Medicine

## 2014-04-17 ENCOUNTER — Ambulatory Visit: Payer: Self-pay

## 2014-04-17 DIAGNOSIS — M25512 Pain in left shoulder: Secondary | ICD-10-CM

## 2014-04-23 ENCOUNTER — Other Ambulatory Visit (HOSPITAL_COMMUNITY): Payer: Self-pay | Admitting: Obstetrics and Gynecology

## 2014-04-23 DIAGNOSIS — Z1231 Encounter for screening mammogram for malignant neoplasm of breast: Secondary | ICD-10-CM

## 2014-05-01 ENCOUNTER — Ambulatory Visit (HOSPITAL_COMMUNITY)
Admission: RE | Admit: 2014-05-01 | Discharge: 2014-05-01 | Disposition: A | Discharge status: Home / Self Care | Payer: Managed Care, Other (non HMO) | Source: Ambulatory Visit | Attending: Obstetrics and Gynecology | Admitting: Obstetrics and Gynecology

## 2014-05-01 DIAGNOSIS — Z1231 Encounter for screening mammogram for malignant neoplasm of breast: Secondary | ICD-10-CM

## 2015-06-23 ENCOUNTER — Other Ambulatory Visit: Payer: Self-pay

## 2015-06-23 DIAGNOSIS — Z803 Family history of malignant neoplasm of breast: Secondary | ICD-10-CM

## 2015-06-23 DIAGNOSIS — Z1231 Encounter for screening mammogram for malignant neoplasm of breast: Secondary | ICD-10-CM

## 2015-07-03 ENCOUNTER — Ambulatory Visit
Admission: RE | Admit: 2015-07-03 | Discharge: 2015-07-03 | Disposition: A | Discharge status: Home / Self Care | Payer: Managed Care, Other (non HMO) | Source: Ambulatory Visit

## 2015-07-03 DIAGNOSIS — Z803 Family history of malignant neoplasm of breast: Secondary | ICD-10-CM

## 2015-07-03 DIAGNOSIS — Z1231 Encounter for screening mammogram for malignant neoplasm of breast: Secondary | ICD-10-CM

## 2015-09-29 ENCOUNTER — Other Ambulatory Visit: Payer: Self-pay | Admitting: Family

## 2015-09-29 DIAGNOSIS — R51 Headache: Secondary | ICD-10-CM

## 2015-09-29 DIAGNOSIS — R519 Headache, unspecified: Secondary | ICD-10-CM

## 2015-09-29 DIAGNOSIS — G935 Compression of brain: Secondary | ICD-10-CM

## 2015-09-29 DIAGNOSIS — Z8673 Personal history of transient ischemic attack (TIA), and cerebral infarction without residual deficits: Secondary | ICD-10-CM

## 2015-10-09 ENCOUNTER — Ambulatory Visit
Admission: RE | Admit: 2015-10-09 | Discharge: 2015-10-09 | Disposition: A | Discharge status: Home / Self Care | Payer: Managed Care, Other (non HMO) | Source: Ambulatory Visit | Attending: Family | Admitting: Family

## 2015-10-09 DIAGNOSIS — Z8673 Personal history of transient ischemic attack (TIA), and cerebral infarction without residual deficits: Secondary | ICD-10-CM

## 2015-10-09 DIAGNOSIS — G935 Compression of brain: Secondary | ICD-10-CM

## 2015-10-09 DIAGNOSIS — R519 Headache, unspecified: Secondary | ICD-10-CM

## 2015-10-09 DIAGNOSIS — R51 Headache: Secondary | ICD-10-CM

## 2019-05-27 ENCOUNTER — Other Ambulatory Visit: Payer: Self-pay

## 2019-05-27 ENCOUNTER — Telehealth: Payer: Self-pay

## 2019-05-27 DIAGNOSIS — Z1231 Encounter for screening mammogram for malignant neoplasm of breast: Secondary | ICD-10-CM

## 2019-06-25 ENCOUNTER — Ambulatory Visit: Payer: Self-pay | Admitting: Advanced Practice Midwife

## 2019-06-25 ENCOUNTER — Other Ambulatory Visit: Payer: Self-pay

## 2019-06-25 ENCOUNTER — Ambulatory Visit
Admission: RE | Admit: 2019-06-25 | Discharge: 2019-06-25 | Disposition: A | Discharge status: Home / Self Care | Payer: Managed Care, Other (non HMO) | Source: Ambulatory Visit | Attending: Obstetrics and Gynecology | Admitting: Obstetrics and Gynecology

## 2019-06-25 VITALS — BP 122/80 | Temp 97.1°F | Wt 204.0 lb

## 2019-06-25 DIAGNOSIS — Z1239 Encounter for other screening for malignant neoplasm of breast: Secondary | ICD-10-CM

## 2019-06-25 DIAGNOSIS — Z1231 Encounter for screening mammogram for malignant neoplasm of breast: Secondary | ICD-10-CM

## 2019-06-25 NOTE — Progress Notes (Signed)
Ms. Jennifer Oneal is a 49 y.o. female who presents to [facility] clinic today with no complaints.    Pap Smear: Pap not smear completed today. Last Pap smear was unknown at [facility] clinic and was normal. Per patient has no history of an abnormal Pap smear. Last Pap smear result is not available in Epic.  Patient has TH in 2012 for abnormal bleeding and fibroids. The had BSO in 2014.    Physical exam: Breasts Breasts symmetrical. No skin abnormalities bilateral breasts. No nipple retraction bilateral breasts. No nipple discharge bilateral breasts. No lymphadenopathy. No lumps palpated bilateral breasts.       Pelvic/Bimanual Pap is not indicated today    Smoking History: Patient has never smoked Not referred to quit line.    Patient Navigation: Patient education provided. Access to services provided for patient through [facility] program. No interpreter provided. No transportation provided   Colorectal Cancer Screening: Per patient has had colonoscopy completed on 2017 No complaints today.    Breast and Cervical Cancer Risk Assessment: Patient has family history of breast cancer, known genetic mutations, or radiation treatment to the chest before age 68. Patient does not have history of cervical dysplasia, immunocompromised, or DES exposure in-utero.  Risk Assessment    Risk Scores      06/25/2019   Last edited by: Narda Rutherford, LPN   5-year risk: 1.8 %   Lifetime risk: 13.7 %          A: BCCCP exam without pap smear Complaint of none  P: Referred patient to the [facility] for a screening mammogram. Appointment scheduled 06/25/2019 at 10:30.  Thressa Sheller DNP, CNM  06/25/19  9:28 AM

## 2020-05-15 ENCOUNTER — Other Ambulatory Visit: Payer: Self-pay | Admitting: Obstetrics and Gynecology

## 2020-05-15 DIAGNOSIS — Z1231 Encounter for screening mammogram for malignant neoplasm of breast: Secondary | ICD-10-CM

## 2020-07-03 ENCOUNTER — Ambulatory Visit
Admission: RE | Admit: 2020-07-03 | Discharge: 2020-07-03 | Disposition: A | Discharge status: Home / Self Care | Payer: BLUE CROSS/BLUE SHIELD | Source: Ambulatory Visit | Attending: Obstetrics and Gynecology | Admitting: Obstetrics and Gynecology

## 2020-07-03 ENCOUNTER — Other Ambulatory Visit: Payer: Self-pay

## 2020-07-03 DIAGNOSIS — Z1231 Encounter for screening mammogram for malignant neoplasm of breast: Secondary | ICD-10-CM

## 2022-09-27 ENCOUNTER — Other Ambulatory Visit: Payer: Self-pay | Admitting: Adult Health Nurse Practitioner

## 2022-09-27 DIAGNOSIS — Z0000 Encounter for general adult medical examination without abnormal findings: Secondary | ICD-10-CM

## 2022-09-28 ENCOUNTER — Other Ambulatory Visit: Payer: Self-pay

## 2022-09-28 ENCOUNTER — Emergency Department (HOSPITAL_BASED_OUTPATIENT_CLINIC_OR_DEPARTMENT_OTHER): Payer: BC Managed Care – PPO

## 2022-09-28 ENCOUNTER — Emergency Department (HOSPITAL_COMMUNITY)
Admission: EM | Admit: 2022-09-28 | Discharge: 2022-09-28 | Disposition: A | Discharge status: Home / Self Care | Payer: BC Managed Care – PPO | Attending: Emergency Medicine | Admitting: Emergency Medicine

## 2022-09-28 DIAGNOSIS — M79605 Pain in left leg: Secondary | ICD-10-CM | POA: Diagnosis present

## 2022-09-28 DIAGNOSIS — M79609 Pain in unspecified limb: Secondary | ICD-10-CM | POA: Diagnosis not present

## 2022-09-28 DIAGNOSIS — M7989 Other specified soft tissue disorders: Secondary | ICD-10-CM | POA: Diagnosis not present

## 2022-09-28 MED ORDER — MUPIROCIN CALCIUM 2 % EX CREA
TOPICAL_CREAM | Freq: Once | CUTANEOUS | Status: AC
Start: 2022-09-28 — End: 2022-09-28
  Filled 2022-09-28: qty 15

## 2022-09-28 NOTE — Discharge Instructions (Addendum)
Today's evaluation has been reassuring.  Please use the provided antibiotic cream on your lower leg wound twice daily for the next 5 days. In regards to the left lateral thigh pain.  Please obtain and use Salonpas large medicated patches including the ingredient methyl salicylate.  There are several formulations of this medication available, be sure to obtain this 1 is a will likely provide the greatest benefit.  Return here for concerning changes in your condition.

## 2022-09-28 NOTE — ED Triage Notes (Signed)
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

## 2022-09-28 NOTE — ED Provider Notes (Signed)
[place] EMERGENCY DEPARTMENT AT [facility] Provider Note   CSN: [id number] Arrival date & time: 09/28/22  5784     History  Chief Complaint  Patient presents with   Leg Pain    Jennifer Oneal is a 52 y.o. female.  HPI Patient presents with concern of leg pain.  She has 2 issues on her left leg that are of concern.  Most acute change is a erythematous lesion in the left anterior lower leg.  She notes that this was previously more prominent, but over the past day has become less so.  It is erythematous, raised.  No distal loss of sensation or weakness. Slightly longer issue is left posterior lateral thigh pain.  She has been seen by her primary care physician and orthopedist, has been diagnosed with sciatica, but notes that though she does have some low back pain, this pain is distinct, with a palpable lesion in the area of concern, with swelling. No recent long distance, no oral contraceptive use, but she is perseverant on blood clot as a cause of her pain.  She has had some relief with previously prescribed medication.     Home Medications Prior to Admission medications   Medication Sig Start Date End Date Taking? Authorizing Provider  acidophilus (RISAQUAD) CAPS Take 1 capsule by mouth daily.    [provider]  ibuprofen (ADVIL,MOTRIN) 800 MG tablet Take 1 tablet (800 mg total) by mouth every 8 (eight) hours as needed (mild pain). 03/14/12   Richarda Overlie, MD  oxyCODONE-acetaminophen (PERCOCET/ROXICET) 5-325 MG per tablet Take 1-2 tablets by mouth every 4 (four) hours as needed (moderate to severe pain (when tolerating fluids)). 03/14/12   Richarda Overlie, MD      Allergies    Patient has no known allergies.    Review of Systems   Review of Systems  All other systems reviewed and are negative.   Physical Exam Updated Vital Signs BP (!) 156/105 (BP Location: Right Arm)   Pulse 80   Temp 98 F (36.7 C) (Oral)   Resp 16   Ht 5\' 2"  (1.575 m)   Wt  90.3 kg   SpO2 100%   BMI 36.40 kg/m  Physical Exam Vitals and nursing note reviewed.  Constitutional:      General: She is not in acute distress.    Appearance: She is well-developed.  HENT:     Head: Normocephalic and atraumatic.  Eyes:     Conjunctiva/sclera: Conjunctivae normal.  Cardiovascular:     Rate and Rhythm: Normal rate and regular rhythm.  Pulmonary:     Effort: Pulmonary effort is normal. No respiratory distress.     Breath sounds: Normal breath sounds. No stridor.  Abdominal:     General: There is no distension.  Musculoskeletal:     Comments: No appreciable deformities.  Patient moves all extremities spontaneously.  Left lateral posterior thigh tender to palpation, unclear if this is slightly raised due to habitus.  Skin:    General: Skin is warm and dry.       Neurological:     Mental Status: She is alert and oriented to person, place, and time.     Cranial Nerves: No cranial nerve deficit.  Psychiatric:        Mood and Affect: Mood normal.     ED Results / Procedures / Treatments   Labs (all labs ordered are listed, but only abnormal results are displayed) Labs Reviewed - No data to display  EKG  None  Radiology VAS [place] LOWER EXTREMITY VENOUS (DVT) (7a-7p)  Result Date: 09/28/2022  Lower Venous DVT Study Patient Name:  Jennifer Oneal  Date of Exam:   09/28/2022 Medical Rec #: [id number]    Accession #:    [id number] Date of Birth: Jul 01, 1970    Patient Gender: F Patient Age:   21 years Exam Location:  [facility] Procedure:      VAS [place] LOWER EXTREMITY VENOUS (DVT) Referring Phys: Molly Maduro Rethel Sebek --------------------------------------------------------------------------------  Indications: Posterolateral thigh pain / localized swelling.  Limitations: Poor ultrasound/tissue interface. Comparison Study: No previous exams Performing Technologist: Jody Hill RVT, RDMS  Examination Guidelines: A complete evaluation includes B-mode imaging, spectral Doppler,  color Doppler, and power Doppler as needed of all accessible portions of each vessel. Bilateral testing is considered an integral part of a complete examination. Limited examinations for reoccurring indications may be performed as noted. The reflux portion of the exam is performed with the patient in reverse Trendelenburg.  +-----+---------------+---------+-----------+----------+--------------+ RIGHTCompressibilityPhasicitySpontaneityPropertiesThrombus Aging +-----+---------------+---------+-----------+----------+--------------+ CFV  Full           Yes      Yes                                 +-----+---------------+---------+-----------+----------+--------------+   +---------+---------------+---------+-----------+----------+--------------+ LEFT     CompressibilityPhasicitySpontaneityPropertiesThrombus Aging +---------+---------------+---------+-----------+----------+--------------+ CFV      Full           Yes      Yes                                 +---------+---------------+---------+-----------+----------+--------------+ SFJ      Full                                                        +---------+---------------+---------+-----------+----------+--------------+ FV Prox  Full           Yes      Yes                                 +---------+---------------+---------+-----------+----------+--------------+ FV Mid   Full           Yes      Yes                                 +---------+---------------+---------+-----------+----------+--------------+ FV DistalFull           Yes      Yes                                 +---------+---------------+---------+-----------+----------+--------------+ PFV      Full                                                        +---------+---------------+---------+-----------+----------+--------------+ POP      Full           Yes  Yes                                  +---------+---------------+---------+-----------+----------+--------------+ PTV      Full                                                        +---------+---------------+---------+-----------+----------+--------------+ PERO     Full                                                        +---------+---------------+---------+-----------+----------+--------------+ No evidence of DVT/SVT seen in area of concern.    Summary: RIGHT: - No evidence of common femoral vein obstruction.  LEFT: - There is no evidence of deep vein thrombosis in the lower extremity.  - No cystic structure found in the popliteal fossa.  *See table(s) above for measurements and observations.    Preliminary     Procedures Procedures    Medications Ordered in ED Medications  mupirocin cream (BACTROBAN) 2 % ( Topical Given 09/28/22 1018)    ED Course/ Medical Decision Making/ A&P                             Medical Decision Making Adult female, previously well presents with 2 concerns, both in the left lower extremity.  Distal lesion consistent with small area of cellulitis, possibly already resolving, patient will receive mupirocin for this, no evidence for bacteremia or sepsis. Patient's thigh lesion with tenderness palpation, is inconsistent with typical sciatica, given concern, this abnormality, ultrasound was performed to exclude DVT.  This study was reassuring, no evidence for DVT.  Patient observed here for hours, had no decompensation, no fever, and without evidence for distal neurovascular compromise, is appropriate for further monitoring, management with her orthopedic team.  Amount and/or Complexity of Data Reviewed External Data Reviewed: notes. Radiology: ordered and independent interpretation performed. Decision-making details documented in ED Course.  Risk Prescription drug management. Decision regarding hospitalization.  11:09 AM Patient awake, alert, in no distress.  We discussed all  findings, patient appropriate for discharge.  Final Clinical Impression(s) / ED Diagnoses Final diagnoses:  Left leg pain    Rx / DC Orders ED Discharge Orders     None         Gerhard Munch, MD 09/28/22 1109

## 2022-09-28 NOTE — Progress Notes (Signed)
LLE venous duplex has been completed.  Preliminary results given to Dr. Jeraldine Loots.   Results can be found under chart review under CV PROC. 09/28/2022 11:06 AM Avanell Banwart RVT, RDMS

## 2022-09-30 ENCOUNTER — Ambulatory Visit
Admission: RE | Admit: 2022-09-30 | Discharge: 2022-09-30 | Disposition: A | Discharge status: Home / Self Care | Payer: BC Managed Care – PPO | Source: Ambulatory Visit | Attending: Adult Health Nurse Practitioner | Admitting: Adult Health Nurse Practitioner

## 2022-09-30 DIAGNOSIS — Z0000 Encounter for general adult medical examination without abnormal findings: Secondary | ICD-10-CM

## 2023-01-09 IMAGING — MG DIGITAL SCREENING BILAT W/ CAD
4 series · 4 of 4 positions shown · non-contrast
Comparison: Previous exam(s).

CLINICAL DATA: Screening.

EXAM:
DIGITAL SCREENING BILATERAL MAMMOGRAM WITH CAD
TECHNIQUE: Bilateral screening digital craniocaudal and mediolateral oblique
mammograms were obtained. The images were evaluated with
computer-aided detection.

[R MLO]
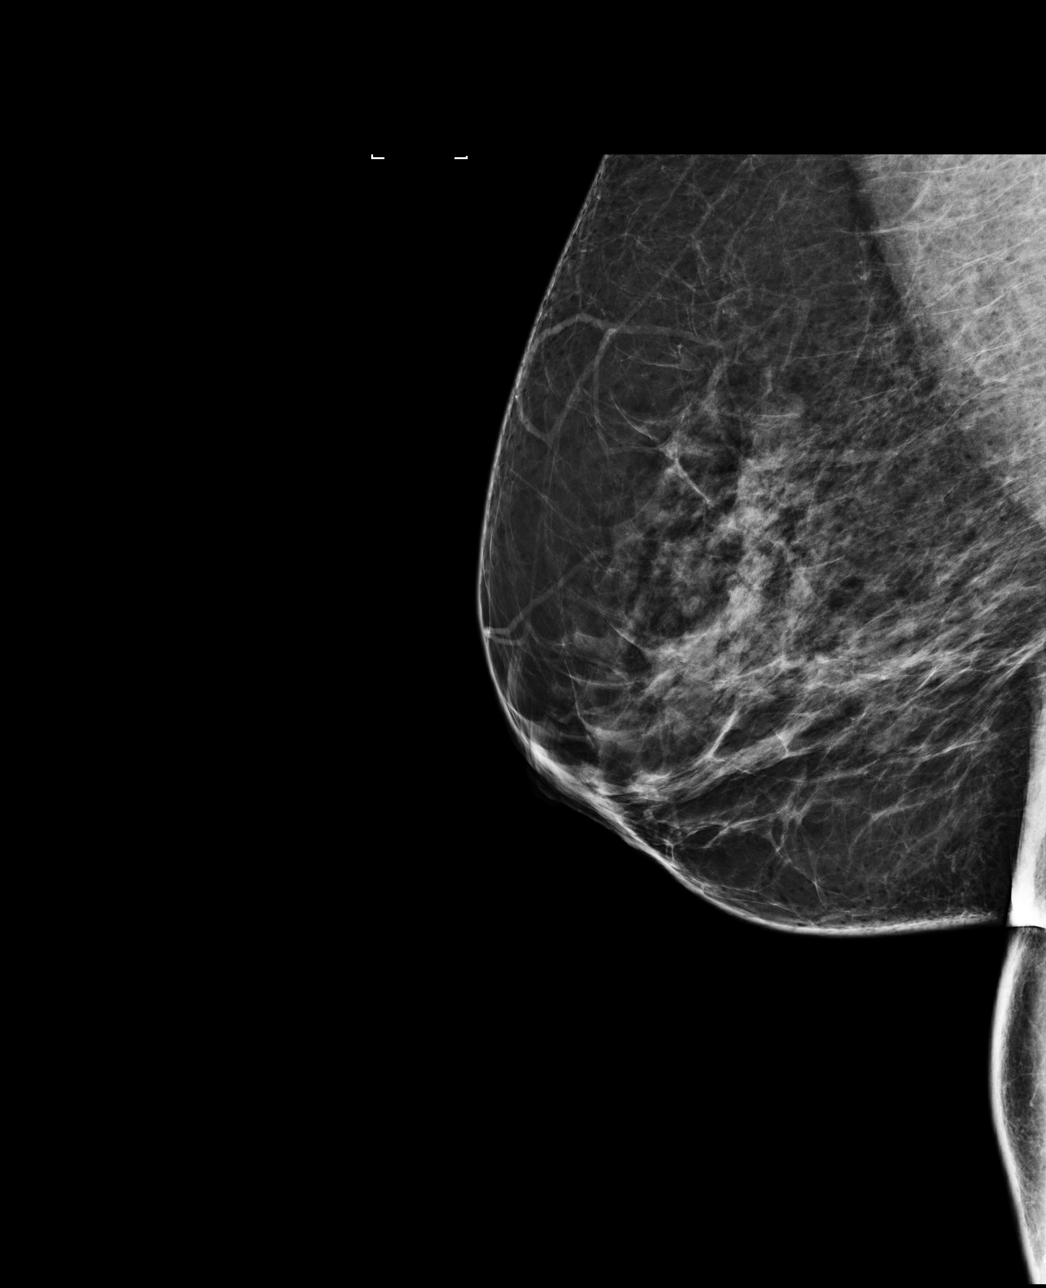

[L CC]
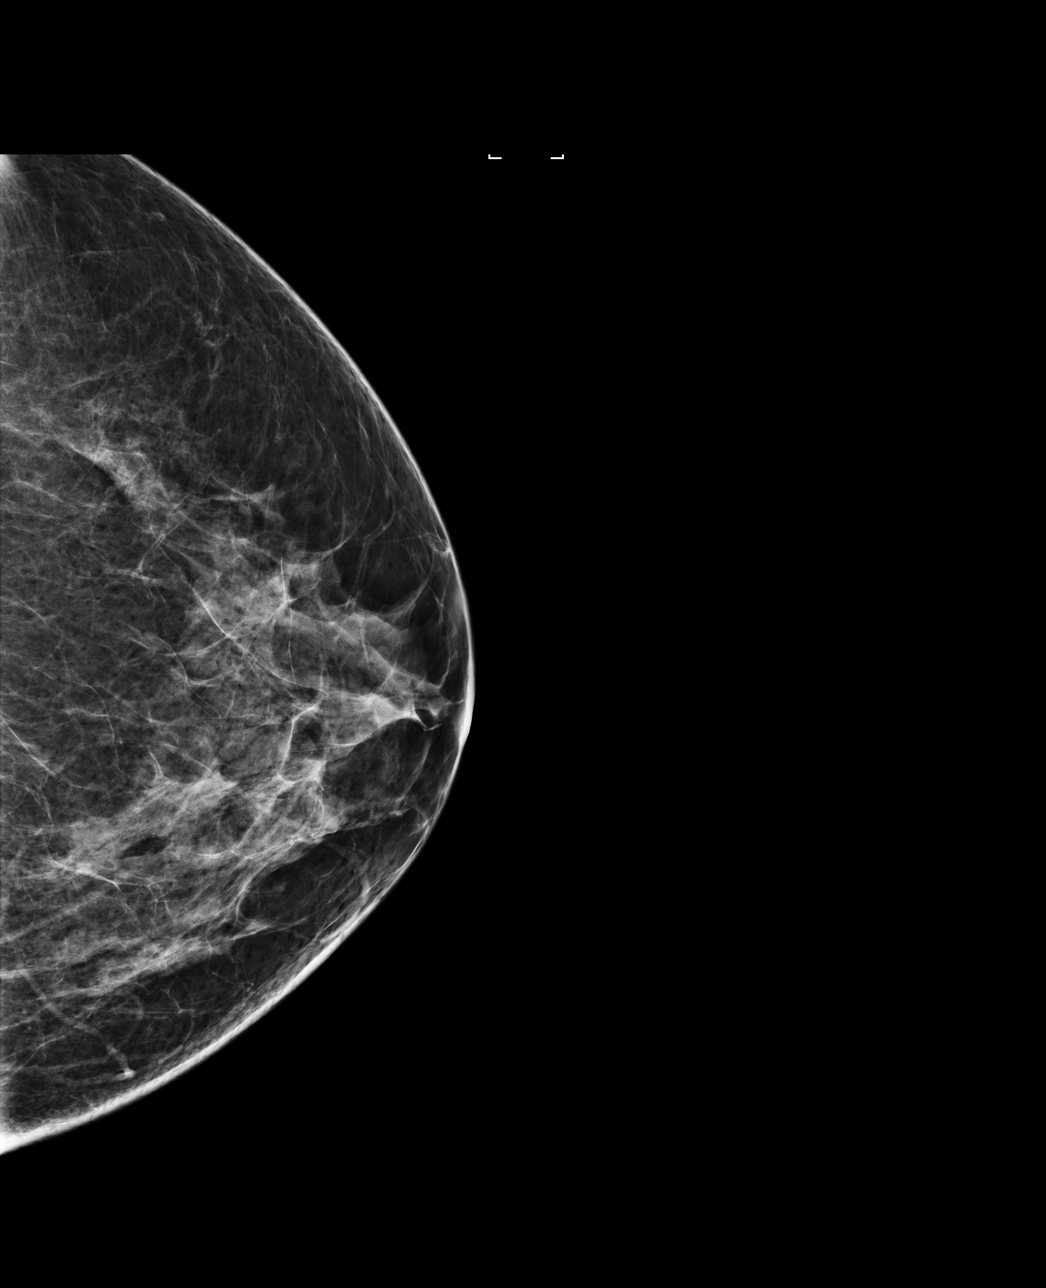

[R CC]
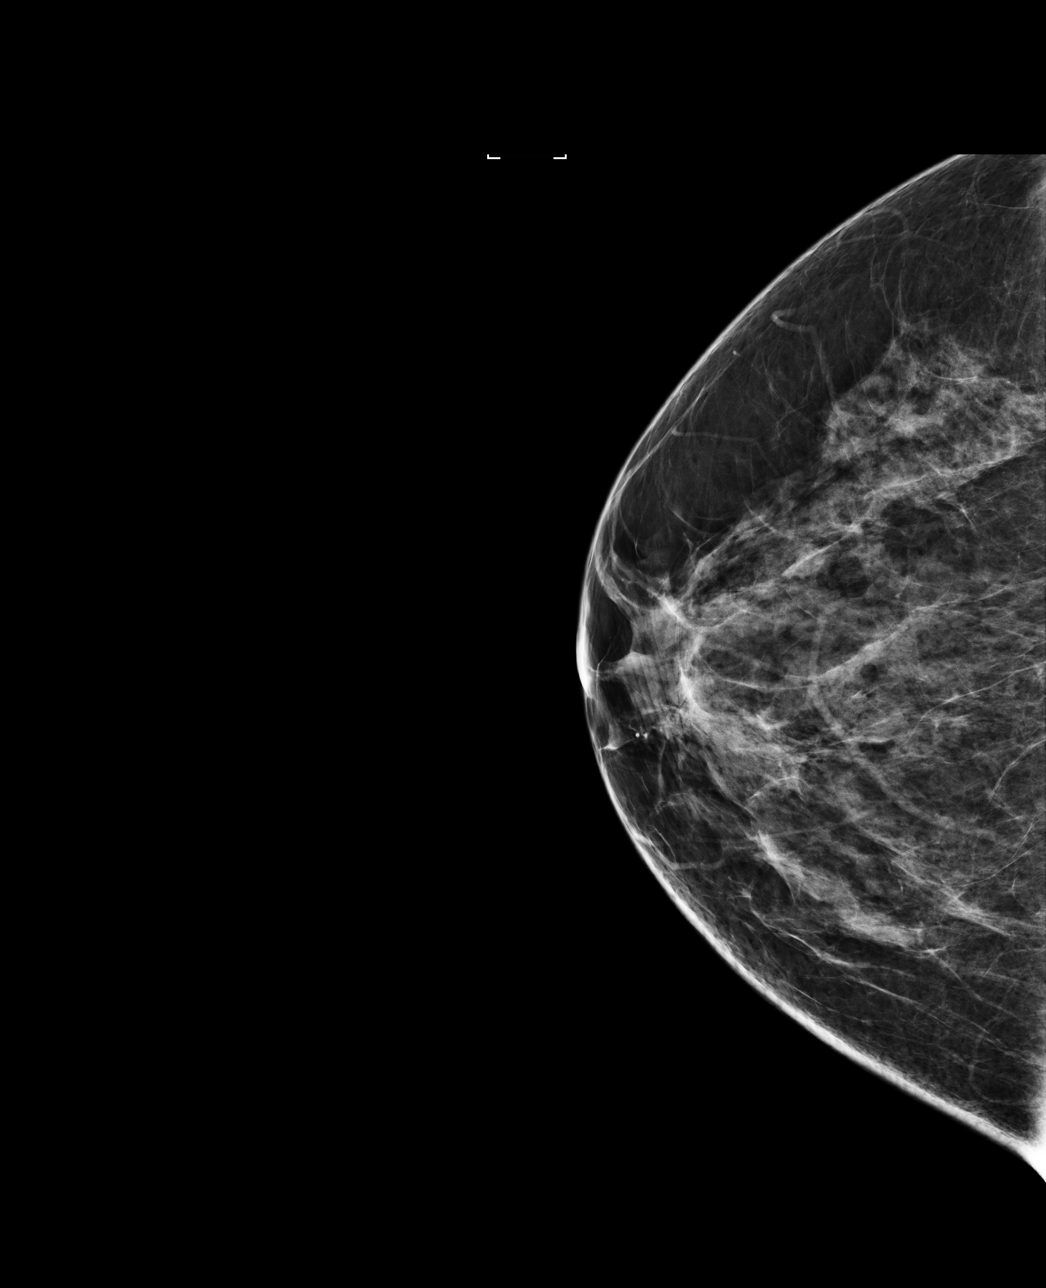

[L MLO]
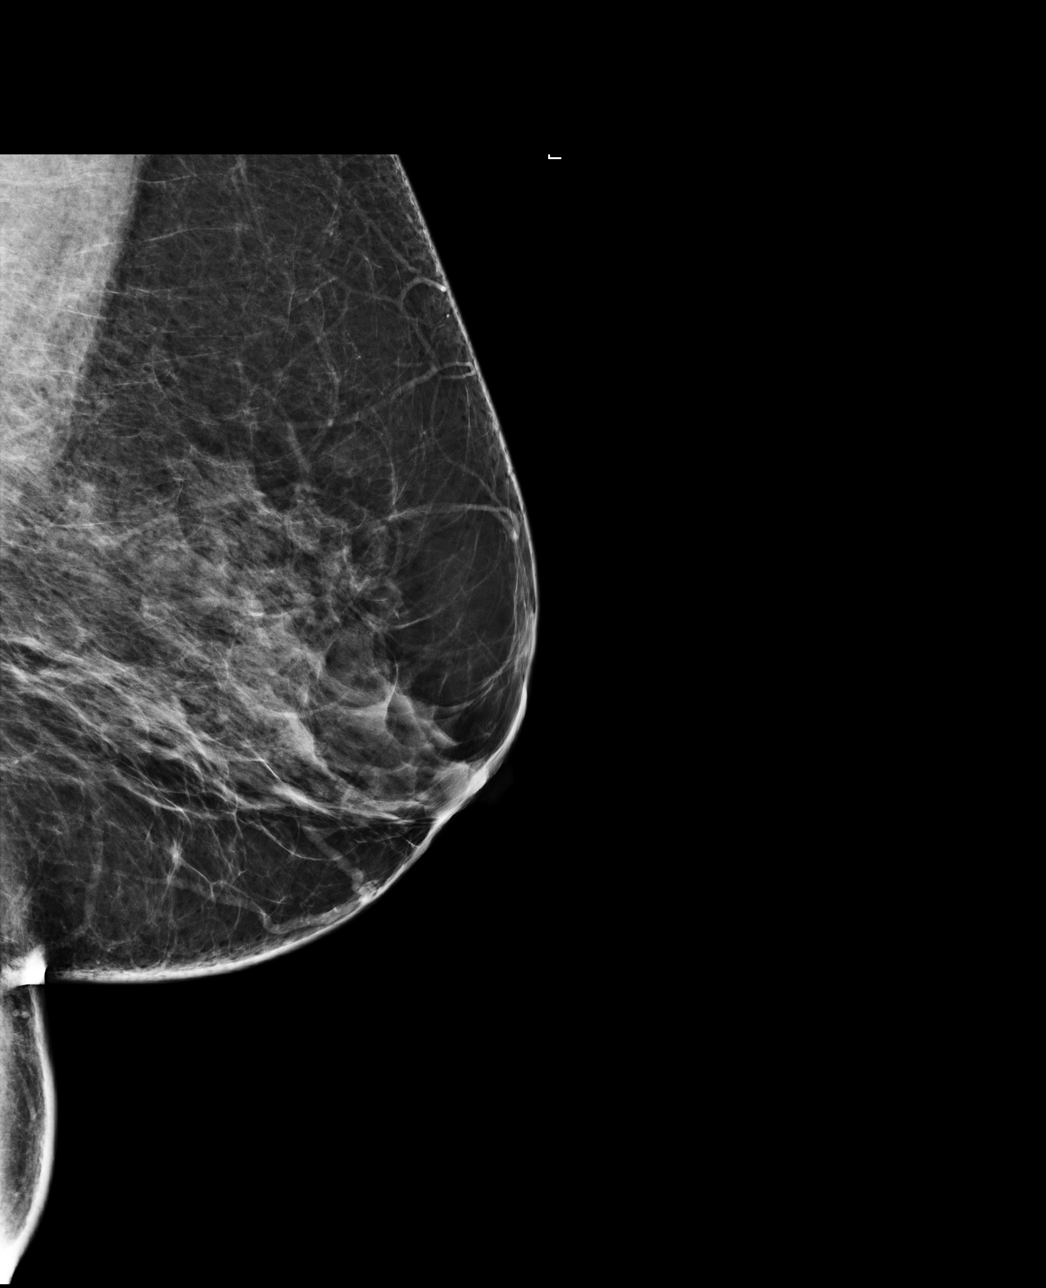

[4 of 4 positions shown; findings below may reference images not displayed]

ACR Breast Density Category c: The breast tissue is heterogeneously
dense, which may obscure small masses.
FINDINGS: There are no findings suspicious for malignancy. The images were
evaluated with computer-aided detection.
IMPRESSION: No mammographic evidence of malignancy. A result letter of this
screening mammogram will be mailed directly to the patient.

RECOMMENDATION:
Screening mammogram in one year. (Code:WI-W-Z4A)

BI-RADS CATEGORY  1: Negative.

## 2023-09-05 ENCOUNTER — Other Ambulatory Visit: Payer: Self-pay | Admitting: Obstetrics and Gynecology

## 2023-09-05 DIAGNOSIS — Z1231 Encounter for screening mammogram for malignant neoplasm of breast: Secondary | ICD-10-CM

## 2023-10-02 ENCOUNTER — Ambulatory Visit
Admission: RE | Admit: 2023-10-02 | Discharge: 2023-10-02 | Disposition: A | Discharge status: Home / Self Care | Source: Ambulatory Visit | Attending: Obstetrics and Gynecology | Admitting: Obstetrics and Gynecology

## 2023-10-02 DIAGNOSIS — Z1231 Encounter for screening mammogram for malignant neoplasm of breast: Secondary | ICD-10-CM

## 2024-04-06 ENCOUNTER — Encounter (HOSPITAL_BASED_OUTPATIENT_CLINIC_OR_DEPARTMENT_OTHER): Payer: Self-pay | Admitting: Emergency Medicine

## 2024-04-06 ENCOUNTER — Other Ambulatory Visit: Payer: Self-pay

## 2024-04-06 ENCOUNTER — Emergency Department (HOSPITAL_BASED_OUTPATIENT_CLINIC_OR_DEPARTMENT_OTHER)
Admission: EM | Admit: 2024-04-06 | Discharge: 2024-04-07 | Disposition: A | Attending: Emergency Medicine | Admitting: Emergency Medicine

## 2024-04-06 DIAGNOSIS — U071 COVID-19: Secondary | ICD-10-CM | POA: Diagnosis not present

## 2024-04-06 DIAGNOSIS — R059 Cough, unspecified: Secondary | ICD-10-CM | POA: Diagnosis present

## 2024-04-06 LAB — RESP PANEL BY RT-PCR (RSV, FLU A&B, COVID)  RVPGX2
Influenza A by PCR: NEGATIVE
Influenza B by PCR: NEGATIVE
Resp Syncytial Virus by PCR: NEGATIVE
SARS Coronavirus 2 by RT PCR: POSITIVE — AB

## 2024-04-06 MED ORDER — LOPERAMIDE HCL 2 MG PO CAPS: 2.0000 mg | ORAL_CAPSULE | Freq: Four times a day (QID) | ORAL | 0 refills | Status: AC | PRN

## 2024-04-06 MED ORDER — ONDANSETRON 4 MG PO TBDP: 4.0000 mg | ORAL_TABLET | Freq: Three times a day (TID) | ORAL | 0 refills | Status: AC | PRN

## 2024-04-06 NOTE — ED Provider Notes (Signed)
 " [place] EMERGENCY DEPARTMENT AT [facility] Provider Note   CSN: [id number] Arrival date & time: 04/06/24  2029     Patient presents with: Nasal Congestion   Jennifer Oneal is a 53 y.o. female.   HPI     This is a 53 year old female who presents with respiratory congestion, cough.  She has had bodyaches, congestion, and runny nose for most a week.  However she developed diarrhea the day after Christmas.  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  She reports chills without documented fevers.  Prior to Admission medications  Medication Sig Start Date End Date Taking? Authorizing Provider  loperamide  (IMODIUM ) 2 MG capsule Take 1 capsule (2 mg total) by mouth 4 (four) times daily as needed for diarrhea or loose stools. 04/06/24  Yes Hosteen Kienast, Charmaine FALCON, MD  ondansetron  (ZOFRAN -ODT) 4 MG disintegrating tablet Take 1 tablet (4 mg total) by mouth every 8 (eight) hours as needed. 04/06/24  Yes Jeda Pardue, Charmaine FALCON, MD  acidophilus (RISAQUAD) CAPS Take 1 capsule by mouth daily.    [provider]  ibuprofen  (ADVIL ,MOTRIN ) 800 MG tablet Take 1 tablet (800 mg total) by mouth every 8 (eight) hours as needed (mild pain). 03/14/12   Johnnye Ade, MD  oxyCODONE -acetaminophen  (PERCOCET/ROXICET) 5-325 MG per tablet Take 1-2 tablets by mouth every 4 (four) hours as needed (moderate to severe pain (when tolerating fluids)). 03/14/12   Johnnye Ade, MD    Allergies: Other    Review of Systems  Constitutional:  Positive for chills. Negative for fever.  Respiratory:  Positive for cough. Negative for shortness of breath.   Cardiovascular:  Negative for chest pain.  Gastrointestinal:  Positive for diarrhea and nausea. Negative for abdominal pain and vomiting.  All other systems reviewed and are negative.   Updated Vital Signs BP (!) 139/90 (BP Location: Right Arm)   Pulse 77   Temp 97.6 F (36.4 C)   Resp 20   SpO2 100%    Physical Exam Vitals and nursing note reviewed.  Constitutional:      Appearance: She is well-developed. She is not ill-appearing.  HENT:     Head: Normocephalic and atraumatic.     Nose: Congestion present.     Mouth/Throat:     Mouth: Mucous membranes are moist.     Comments: Uvula midline, no exudate noted Eyes:     Pupils: Pupils are equal, round, and reactive to light.  Cardiovascular:     Rate and Rhythm: Normal rate and regular rhythm.     Heart sounds: Normal heart sounds.  Pulmonary:     Effort: Pulmonary effort is normal. No respiratory distress.     Breath sounds: No wheezing.  Abdominal:     Palpations: Abdomen is soft.     Tenderness: There is no abdominal tenderness.  Musculoskeletal:     Cervical back: Neck supple.  Skin:    General: Skin is warm and dry.  Neurological:     Mental Status: She is alert and oriented to person, place, and time.  Psychiatric:        Mood and Affect: Mood normal.     (all labs ordered are listed, but only abnormal results are displayed) Labs Reviewed  RESP PANEL BY RT-PCR (RSV, FLU A&B, COVID)  [id number] - Abnormal; Notable for the following components:      Result Value   SARS Coronavirus 2 by RT PCR POSITIVE (*)    All other components  within normal limits    EKG: None  Radiology: No results found.   Procedures   Medications Ordered in the ED - No data to display                                  Medical Decision Making Risk Prescription drug management.   This patient presents to the ED for concern of upper respiratory symptoms, diarrhea, this involves an extensive number of treatment options, and is a complaint that carries with it a high risk of complications and morbidity.  I considered the following differential and admission for this acute, potentially life threatening condition.  The differential diagnosis includes COVID, flu, other viral etiology, gastroenteritis, pneumonia  MDM:    This is a  53 year old female who presents with upper respiratory symptoms and diarrhea.  She is nontoxic and vital signs are reassuring.  Physical exam is fairly benign.  COVID testing is positive.  Likely the culprit.  We discussed supportive measures.  Will start Imodium  for diarrhea and Zofran  for nausea.  Breath sounds are clear.  Low suspicion for pneumonia.  Will defer imaging at this time.  (Labs, imaging, consults)  Labs: I Ordered, and personally interpreted labs.  The pertinent results include: COVID, flu  Imaging Studies ordered: I ordered imaging studies including none I independently visualized and interpreted imaging. I agree with the radiologist interpretation  Additional history obtained from chart review.  External records from outside source obtained and reviewed including prior evaluations  Cardiac Monitoring: The patient was not maintained on a cardiac monitor.  If on the cardiac monitor, I personally viewed and interpreted the cardiac monitored which showed an underlying rhythm of: N/A  Reevaluation: After the interventions noted above, I reevaluated the patient and found that they have :stayed the same  Social Determinants of Health:  lives independently  Disposition: Discharge  Co morbidities that complicate the patient evaluation  Past Medical History:  Diagnosis Date   Arthritis    right knee   Chronic heartburn    Headache(784.0)    from lack of sleep     Medicines Meds ordered this encounter  Medications   ondansetron  (ZOFRAN -ODT) 4 MG disintegrating tablet    Sig: Take 1 tablet (4 mg total) by mouth every 8 (eight) hours as needed.    Dispense:  20 tablet    Refill:  0   loperamide  (IMODIUM ) 2 MG capsule    Sig: Take 1 capsule (2 mg total) by mouth 4 (four) times daily as needed for diarrhea or loose stools.    Dispense:  12 capsule    Refill:  0    I have reviewed the patients home medicines and have made adjustments as needed  Problem List / ED  Course: Problem List Items Addressed This Visit   None Visit Diagnoses       COVID-19    -  Primary                Final diagnoses:  COVID-19    ED Discharge Orders          Ordered    ondansetron  (ZOFRAN -ODT) 4 MG disintegrating tablet  Every 8 hours PRN        04/06/24 2308    loperamide  (IMODIUM ) 2 MG capsule  4 times daily PRN        04/06/24 2308  Bari Charmaine FALCON, MD 04/06/24 [phone]  "

## 2024-04-06 NOTE — ED Triage Notes (Signed)
 Flu like/ cold  symptoms Congestion, runny nose cough, body aches Started x 1 week getting worse/ not getting better

## 2024-04-06 NOTE — Discharge Instructions (Addendum)
 You were seen today for upper respiratory symptoms and diarrhea.  You tested positive for COVID.  Make sure that you are staying hydrated.  Tylenol  or Motrin  for any body aches or pains.  Imodium  for diarrhea and Zofran  for nausea.  You may return to work when 24 hours fever free/symptom free.
